# Patient Record
Sex: Male | Born: 1985 | Race: White | Hispanic: No | Marital: Married | Smoking: Current every day smoker
Health system: Southern US, Community
[De-identification: ages and names within clinical notes are randomized; demographics above are authoritative.]

## PROBLEM LIST (undated history)

## (undated) HISTORY — PX: RHINOPLASTY: SUR1284

---
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
## 2004-07-03 ENCOUNTER — Emergency Department (HOSPITAL_COMMUNITY): Admission: EM | Admit: 2004-07-03 | Discharge: 2004-07-03 | Payer: Self-pay | Admitting: Emergency Medicine

## 2005-06-03 ENCOUNTER — Ambulatory Visit: Payer: Self-pay | Admitting: Family Medicine

## 2005-06-14 ENCOUNTER — Ambulatory Visit: Payer: Self-pay | Admitting: *Deleted

## 2005-06-15 ENCOUNTER — Ambulatory Visit: Payer: Self-pay | Admitting: Family Medicine

## 2005-06-16 ENCOUNTER — Ambulatory Visit (HOSPITAL_COMMUNITY): Admission: RE | Admit: 2005-06-16 | Discharge: 2005-06-16 | Payer: Self-pay | Admitting: Internal Medicine

## 2005-06-23 ENCOUNTER — Ambulatory Visit: Payer: Self-pay | Admitting: Family Medicine
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
## 2010-10-24 ENCOUNTER — Encounter: Payer: Self-pay | Admitting: Family Medicine

## 2012-10-29 ENCOUNTER — Emergency Department (HOSPITAL_COMMUNITY)
Admission: EM | Admit: 2012-10-29 | Discharge: 2012-10-29 | Disposition: A | Discharge status: Home / Self Care | Payer: Medicaid Other | Attending: Emergency Medicine | Admitting: Emergency Medicine

## 2012-10-29 ENCOUNTER — Emergency Department (HOSPITAL_COMMUNITY): Payer: Medicaid Other

## 2012-10-29 ENCOUNTER — Encounter (HOSPITAL_COMMUNITY): Payer: Self-pay | Admitting: *Deleted

## 2012-10-29 DIAGNOSIS — R059 Cough, unspecified: Secondary | ICD-10-CM | POA: Insufficient documentation

## 2012-10-29 DIAGNOSIS — J3489 Other specified disorders of nose and nasal sinuses: Secondary | ICD-10-CM

## 2012-10-29 DIAGNOSIS — R05 Cough: Secondary | ICD-10-CM

## 2012-10-29 DIAGNOSIS — Z8709 Personal history of other diseases of the respiratory system: Secondary | ICD-10-CM | POA: Insufficient documentation

## 2012-10-29 DIAGNOSIS — Z79899 Other long term (current) drug therapy: Secondary | ICD-10-CM | POA: Insufficient documentation

## 2012-10-29 DIAGNOSIS — F17200 Nicotine dependence, unspecified, uncomplicated: Secondary | ICD-10-CM | POA: Insufficient documentation

## 2012-10-29 DIAGNOSIS — Z8781 Personal history of (healed) traumatic fracture: Secondary | ICD-10-CM | POA: Insufficient documentation

## 2012-10-29 DIAGNOSIS — S0993XA Unspecified injury of face, initial encounter: Secondary | ICD-10-CM | POA: Insufficient documentation

## 2012-10-29 MED ORDER — IBUPROFEN 800 MG PO TABS
800.0000 mg | ORAL_TABLET | Freq: Once | ORAL | Status: AC
  Administered 2012-10-29: 800 mg via ORAL
  Filled 2012-10-29: qty 4

## 2012-10-29 MED ORDER — AZITHROMYCIN 250 MG PO TABS: 250.0000 mg | ORAL_TABLET | Freq: Every day | ORAL | Status: AC
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
## 2012-10-30 NOTE — ED Provider Notes (Signed)
Medical screening examination/treatment/procedure(s) were performed by non-physician practitioner and as supervising physician I was immediately available for consultation/collaboration.  Geoffery Lyons, MD 10/30/12 1758
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
## 2012-11-02 NOTE — Progress Notes (Signed)
During [facility] ED 10/29/12 visit pt was seen by [facility] liaison  Pt offered services to assist with finding a [place] self pay provider, resources & health reform information  

## 2014-03-01 IMAGING — CR DG NASAL BONES 3+V
3 series · 3 of 3 positions shown · non-contrast
Comparison: 06/16/2005.

CLINICAL DATA: Fall.  Right-sided nasal pain.  History of frontal
plasty.

NASAL BONES - 3+ VIEW

[w waters pa]
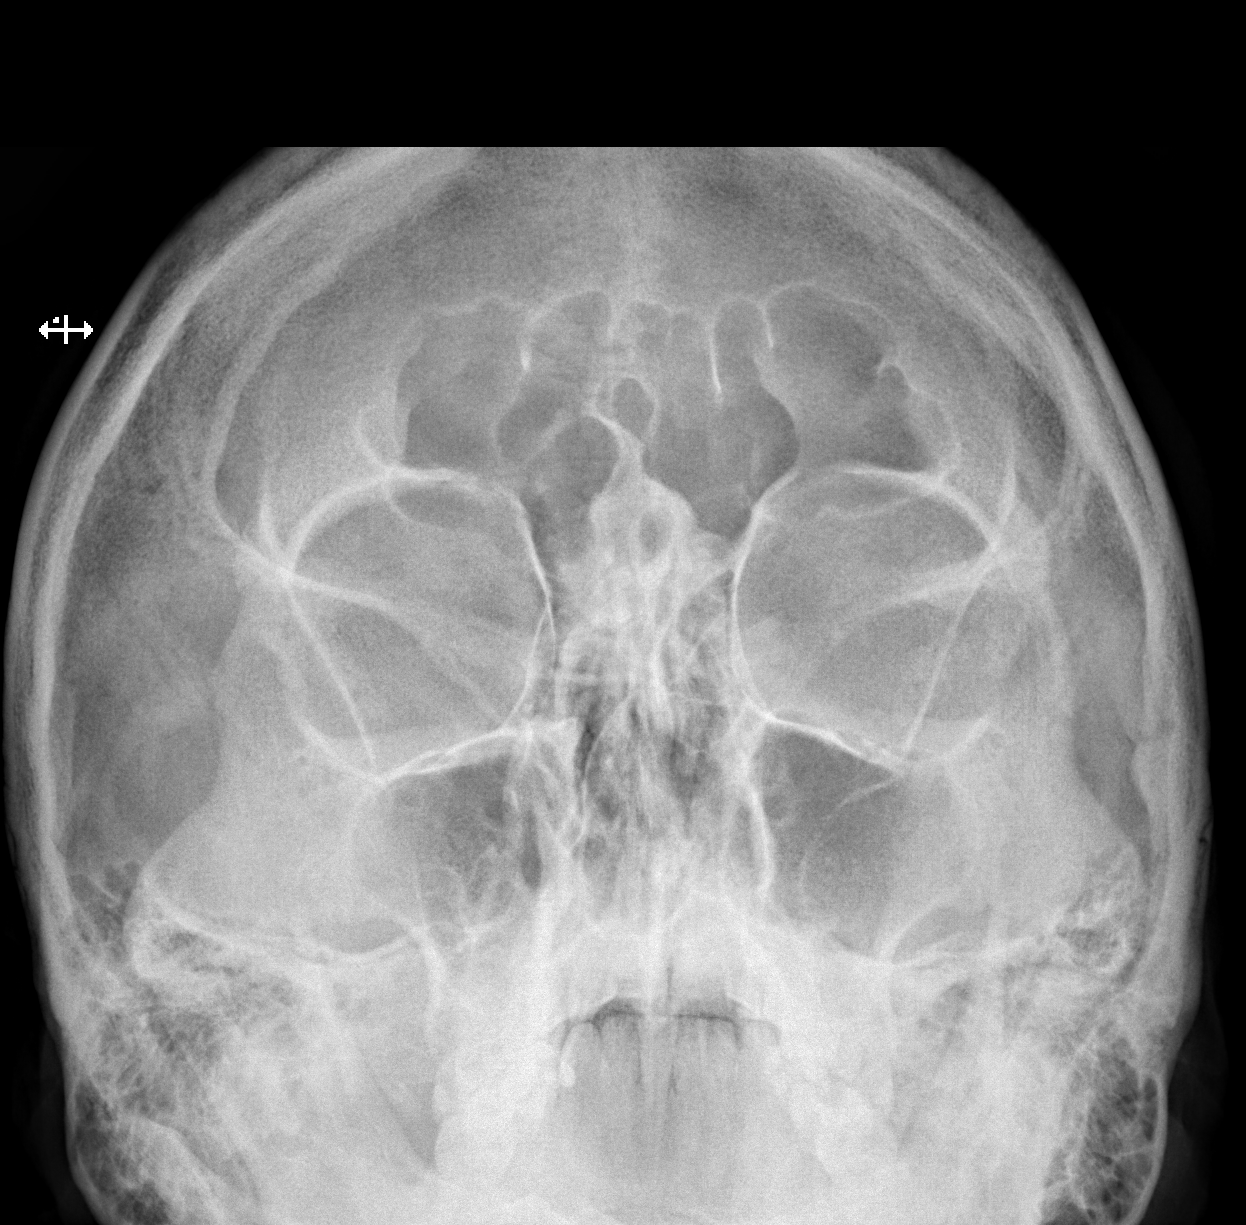

[w nasal bone lat (1 of 2)]
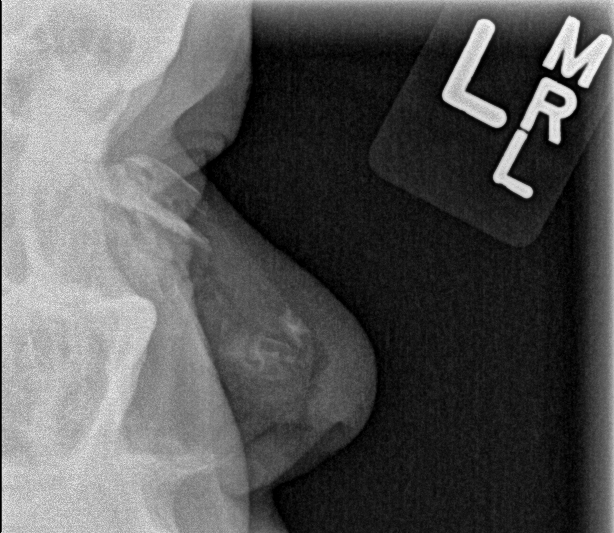

[w nasal bone lat (2 of 2)]
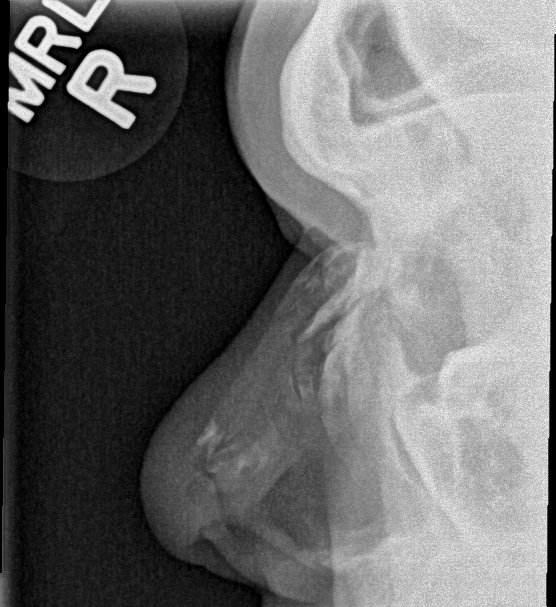

[3 of 3 positions shown; findings below may reference images not displayed]

FINDINGS: There is abnormal morphology of the superior nasal alar
cartilage and nasal bones.  Calcification of the alar nasal
cartilage and cartilaginous nasal septum is present.  No displaced
nasal bone fracture is present allowing for the postoperative
changes and dystrophic changes.
IMPRESSION: No acute osseous abnormality.  Abnormal morphology of the nasal
bones and nasal cartilage which may be post-traumatic and / or
postoperative.

## 2017-07-27 NOTE — Congregational Nurse Program (Signed)
Congregational Nurse Program Note  Date of Encounter: 07/27/2017  Past Medical History: No past medical history on file.  Encounter Details:     CNP Questionnaire - 07/27/17 1652      Patient Demographics   Is this a new or existing patient? New   Patient is considered a/an Not Applicable   Race Caucasian/White     Patient Assistance   Location of Patient Assistance [place]   Patient's financial/insurance status Low Income;Self-Pay (Uninsured)   Uninsured Patient ([place]) Yes   Interventions Counseled to make appt. with provider   Patient referred to apply for the following financial assistance Not Applicable   Food insecurities addressed Referred to food bank or resource   Transportation assistance Yes   Type of Assistance Bus Pass Given   Assistance securing medications No   Educational health offerings Acute disease;Safety;Navigating the healthcare system     Encounter Details   Primary purpose of visit Acute Illness/Condition Visit   Was an Emergency Department visit averted? Not Applicable   Does patient have a medical provider? No   Patient referred to Clinic   Was a mental health screening completed? (GAINS tool) No   Does patient have dental issues? No   Does patient have vision issues? No   Does your patient have an abnormal blood pressure today? No   Since previous encounter, have you referred patient for abnormal blood pressure that resulted in a new diagnosis or medication change? No   Does your patient have an abnormal blood glucose today? No   Since previous encounter, have you referred patient for abnormal blood glucose that resulted in a new diagnosis or medication change? No   Was there a life-saving intervention made? No     States he believes he has a sinus infection.  Encouraged to See Chales AbrahamsMary Ann Placey on Monday (states has seen her before).  If he is seen by another provider and has a prescription, I informed him that I could  assist him with obtaining his medication

## 2017-10-03 DEATH — deceased
